# Patient Record
Sex: Male | Born: 1985 | Race: White | Hispanic: No | Marital: Married | Smoking: Current every day smoker
Health system: Southern US, Community
[De-identification: ages and names within clinical notes are randomized; demographics above are authoritative.]

## PROBLEM LIST (undated history)

## (undated) DIAGNOSIS — H53009 Unspecified amblyopia, unspecified eye: Secondary | ICD-10-CM

## (undated) HISTORY — PX: WISDOM TOOTH EXTRACTION: SHX21

## (undated) HISTORY — PX: EYE SURGERY: SHX253

---

## 2011-12-08 ENCOUNTER — Emergency Department (HOSPITAL_BASED_OUTPATIENT_CLINIC_OR_DEPARTMENT_OTHER)
Admission: EM | Admit: 2011-12-08 | Discharge: 2011-12-08 | Disposition: A | Discharge status: Home / Self Care | Payer: Self-pay | Attending: Emergency Medicine | Admitting: Emergency Medicine

## 2011-12-08 ENCOUNTER — Encounter (HOSPITAL_BASED_OUTPATIENT_CLINIC_OR_DEPARTMENT_OTHER): Payer: Self-pay | Admitting: Emergency Medicine

## 2011-12-08 DIAGNOSIS — H60399 Other infective otitis externa, unspecified ear: Secondary | ICD-10-CM | POA: Insufficient documentation

## 2011-12-08 DIAGNOSIS — F17200 Nicotine dependence, unspecified, uncomplicated: Secondary | ICD-10-CM | POA: Insufficient documentation

## 2011-12-08 DIAGNOSIS — H6090 Unspecified otitis externa, unspecified ear: Secondary | ICD-10-CM

## 2011-12-08 HISTORY — DX: Unspecified amblyopia, unspecified eye: H53.009

## 2011-12-08 MED ORDER — CIPROFLOXACIN-HYDROCORTISONE 0.2-1 % OT SUSP: 3.0000 [drp] | Freq: Two times a day (BID) | OTIC | Status: AC

## 2011-12-08 NOTE — ED Notes (Signed)
Rec'd call from pt requesting new rx since original was "too expensive"  RX changed to Cortiosporin Otic Suspension 4 gtts to affected ear 4xday for 7days by Doran Durand, PA as discussed with Dr Fonnie Jarvis.  RX called to [facility] in [place] [phone] and spoke with Sam, PharmD.  Pt was called back by Heather, NS and informed of rx change and location

## 2011-12-08 NOTE — ED Notes (Signed)
Pt c/o LT ear pain x 2 days

## 2011-12-08 NOTE — ED Provider Notes (Signed)
Medical screening examination/treatment/procedure(s) were performed by non-physician practitioner and as supervising physician I was immediately available for consultation/collaboration.   Charles B. Bernette Mayers, MD 12/08/11 2152

## 2011-12-08 NOTE — ED Provider Notes (Signed)
History     CSN: [id number]  Arrival date & time 12/08/11  1111   First MD Initiated Contact with Patient 12/08/11 1204      Chief Complaint  Patient presents with  . Otalgia    (Consider location/radiation/quality/duration/timing/severity/associated sxs/prior treatment) Patient is a 26 y.o. male presenting with ear pain. The history is provided by the patient.  Otalgia This is a new problem. The current episode started 2 days ago. There is pain in the left ear. The problem occurs constantly. The problem has been gradually worsening. There has been no fever. Pertinent negatives include no ear discharge, no headaches, no hearing loss, no rhinorrhea, no sore throat, no vomiting, no neck pain, no cough and no rash. His past medical history does not include chronic ear infection or hearing loss.    Past Medical History  Diagnosis Date  . Lazy eye     RT    Past Surgical History  Procedure Date  . Eye surgery   . Wisdom tooth extraction     No family history on file.  History  Substance Use Topics  . Smoking status: Current Everyday Smoker -- 0.2 packs/day    Types: Cigarettes  . Smokeless tobacco: Not on file  . Alcohol Use:       Review of Systems  Constitutional: Negative for fever and chills.  HENT: Positive for ear pain. Negative for hearing loss, congestion, sore throat, rhinorrhea, neck pain, neck stiffness, postnasal drip, sinus pressure, tinnitus and ear discharge.   Respiratory: Negative for cough.   Gastrointestinal: Negative for nausea and vomiting.  Skin: Negative for color change and rash.  Neurological: Negative for dizziness, light-headedness and headaches.    Allergies  Penicillins  Home Medications  No current outpatient prescriptions on file.  BP 133/86  Pulse 86  Temp 97.7 F (36.5 C) (Oral)  Resp 16  Ht 5\' 11"  (1.803 m)  Wt 190 lb (86.183 kg)  BMI 26.50 kg/m2  SpO2 99%  Physical Exam  Nursing note and vitals  reviewed. Constitutional: He is oriented to person, place, and time. He appears well-developed and well-nourished. No distress.  HENT:  Head: Normocephalic and atraumatic.  Right Ear: Hearing, tympanic membrane, external ear and ear canal normal. No drainage.  Left Ear: Hearing, tympanic membrane and external ear normal. No drainage.  Nose: Nose normal. Right sinus exhibits no maxillary sinus tenderness and no frontal sinus tenderness. Left sinus exhibits no maxillary sinus tenderness and no frontal sinus tenderness.  Mouth/Throat: Uvula is midline, oropharynx is clear and moist and mucous membranes are normal.       Erythema and swelling of the left EAC Pain with retraction of the left ear No mastoid tenderness  Neck: Normal range of motion. Neck supple.  Cardiovascular: Normal rate, regular rhythm and normal heart sounds.   Pulmonary/Chest: Effort normal and breath sounds normal. No respiratory distress.  Neurological: He is alert and oriented to person, place, and time.  Skin: Skin is warm and dry. He is not diaphoretic. No erythema.  Psychiatric: He has a normal mood and affect.    ED Course  Procedures (including critical care time)  Labs Reviewed - No data to display No results found.   No diagnosis found.    MDM  Patient with signs and symptoms consistent with Acute Otitis Externa.  Patient prescribed antibiotic ear drops and discharged home.          Pascal Lux [place], PA-C 12/08/11 1232

## 2014-05-01 ENCOUNTER — Encounter (HOSPITAL_BASED_OUTPATIENT_CLINIC_OR_DEPARTMENT_OTHER): Payer: Self-pay

## 2014-05-01 DIAGNOSIS — Z720 Tobacco use: Secondary | ICD-10-CM | POA: Insufficient documentation

## 2014-05-01 DIAGNOSIS — Z880 Allergy status to penicillin: Secondary | ICD-10-CM | POA: Diagnosis not present

## 2014-05-01 DIAGNOSIS — Y9289 Other specified places as the place of occurrence of the external cause: Secondary | ICD-10-CM | POA: Diagnosis not present

## 2014-05-01 DIAGNOSIS — X58XXXA Exposure to other specified factors, initial encounter: Secondary | ICD-10-CM | POA: Insufficient documentation

## 2014-05-01 DIAGNOSIS — Z8669 Personal history of other diseases of the nervous system and sense organs: Secondary | ICD-10-CM | POA: Diagnosis not present

## 2014-05-01 DIAGNOSIS — S39012A Strain of muscle, fascia and tendon of lower back, initial encounter: Secondary | ICD-10-CM | POA: Insufficient documentation

## 2014-05-01 DIAGNOSIS — Y9389 Activity, other specified: Secondary | ICD-10-CM | POA: Diagnosis not present

## 2014-05-01 DIAGNOSIS — S3992XA Unspecified injury of lower back, initial encounter: Secondary | ICD-10-CM | POA: Diagnosis present

## 2014-05-01 DIAGNOSIS — Y990 Civilian activity done for income or pay: Secondary | ICD-10-CM | POA: Diagnosis not present

## 2014-05-01 NOTE — ED Notes (Addendum)
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 Denies incontinence. Denies numbness/tingling.

## 2014-05-02 ENCOUNTER — Emergency Department (HOSPITAL_BASED_OUTPATIENT_CLINIC_OR_DEPARTMENT_OTHER)
Admission: EM | Admit: 2014-05-02 | Discharge: 2014-05-02 | Disposition: A | Discharge status: Home / Self Care | Payer: BLUE CROSS/BLUE SHIELD | Attending: Emergency Medicine | Admitting: Emergency Medicine

## 2014-05-02 DIAGNOSIS — S39012A Strain of muscle, fascia and tendon of lower back, initial encounter: Secondary | ICD-10-CM

## 2014-05-02 MED ORDER — DIAZEPAM 5 MG PO TABS
5.0000 mg | ORAL_TABLET | Freq: Once | ORAL | Status: AC
  Administered 2014-05-02: 5 mg via ORAL
  Filled 2014-05-02: qty 1

## 2014-05-02 MED ORDER — KETOROLAC TROMETHAMINE 60 MG/2ML IM SOLN
60.0000 mg | Freq: Once | INTRAMUSCULAR | Status: AC
  Administered 2014-05-02: 60 mg via INTRAMUSCULAR
  Filled 2014-05-02: qty 2

## 2014-05-02 MED ORDER — NAPROXEN 500 MG PO TABS: 500.0000 mg | ORAL_TABLET | Freq: Two times a day (BID) | ORAL | Status: DC | PRN

## 2014-05-02 MED ORDER — ACETAMINOPHEN 500 MG PO TABS
1000.0000 mg | ORAL_TABLET | Freq: Once | ORAL | Status: AC
  Administered 2014-05-02: 1000 mg via ORAL
  Filled 2014-05-02: qty 2
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
## 2014-05-02 NOTE — Discharge Instructions (Signed)
If you were given medicines take as directed.  If you are on coumadin or contraceptives realize their levels and effectiveness is altered by many different medicines.  If you have any reaction (rash, tongues swelling, other) to the medicines stop taking and see a physician.   Please follow up as directed and return to the ER or see a physician for new or worsening symptoms.  Thank you. Filed Vitals:   05/01/14 2349  BP: 142/77  Pulse: 90  Temp: 98.6 F (37 C)  Resp: 18  Height: 5\' 11"  (1.803 m)  Weight: 180 lb (81.647 kg)  SpO2: 98%

## 2014-06-27 ENCOUNTER — Encounter (HOSPITAL_BASED_OUTPATIENT_CLINIC_OR_DEPARTMENT_OTHER): Payer: Self-pay

## 2014-06-27 ENCOUNTER — Emergency Department (HOSPITAL_BASED_OUTPATIENT_CLINIC_OR_DEPARTMENT_OTHER)
Admission: EM | Admit: 2014-06-27 | Discharge: 2014-06-27 | Disposition: A | Discharge status: Home / Self Care | Payer: BLUE CROSS/BLUE SHIELD | Attending: Emergency Medicine | Admitting: Emergency Medicine

## 2014-06-27 ENCOUNTER — Emergency Department (HOSPITAL_BASED_OUTPATIENT_CLINIC_OR_DEPARTMENT_OTHER): Payer: BLUE CROSS/BLUE SHIELD

## 2014-06-27 DIAGNOSIS — R0602 Shortness of breath: Secondary | ICD-10-CM | POA: Diagnosis not present

## 2014-06-27 DIAGNOSIS — R079 Chest pain, unspecified: Secondary | ICD-10-CM | POA: Diagnosis not present

## 2014-06-27 DIAGNOSIS — R1110 Vomiting, unspecified: Secondary | ICD-10-CM | POA: Insufficient documentation

## 2014-06-27 DIAGNOSIS — Z79899 Other long term (current) drug therapy: Secondary | ICD-10-CM | POA: Diagnosis not present

## 2014-06-27 DIAGNOSIS — Z720 Tobacco use: Secondary | ICD-10-CM | POA: Insufficient documentation

## 2014-06-27 DIAGNOSIS — Z880 Allergy status to penicillin: Secondary | ICD-10-CM | POA: Insufficient documentation

## 2014-06-27 DIAGNOSIS — R05 Cough: Secondary | ICD-10-CM | POA: Insufficient documentation

## 2014-06-27 LAB — CBC
HEMATOCRIT: 45.3 % (ref 39.0–52.0)
HEMOGLOBIN: 15.5 g/dL (ref 13.0–17.0)
MCH: 30.9 pg (ref 26.0–34.0)
MCHC: 34.2 g/dL (ref 30.0–36.0)
MCV: 90.2 fL (ref 78.0–100.0)
Platelets: 180 10*3/uL (ref 150–400)
RBC: 5.02 MIL/uL (ref 4.22–5.81)
RDW: 12.8 % (ref 11.5–15.5)
WBC: 12.2 10*3/uL — ABNORMAL HIGH (ref 4.0–10.5)

## 2014-06-27 LAB — BASIC METABOLIC PANEL
Anion gap: 5 (ref 5–15)
BUN: 22 mg/dL (ref 6–23)
CALCIUM: 9 mg/dL (ref 8.4–10.5)
CO2: 28 mmol/L (ref 19–32)
CREATININE: 1.04 mg/dL (ref 0.50–1.35)
Chloride: 102 mmol/L (ref 96–112)
GFR calc non Af Amer: >90 mL/min (ref >90)
GLUCOSE: 118 mg/dL — AB (ref 70–99)
Potassium: 3.8 mmol/L (ref 3.5–5.1)
Sodium: 135 mmol/L (ref 135–145)

## 2014-06-27 LAB — TROPONIN I: Troponin I: <0.03 ng/mL (ref <0.031)

## 2014-06-27 MED ORDER — SODIUM CHLORIDE 0.9 % IV BOLUS (SEPSIS)
1000.0000 mL | INTRAVENOUS | Status: AC
  Administered 2014-06-27: 1000 mL via INTRAVENOUS

## 2014-06-27 MED ORDER — NAPROXEN 500 MG PO TABS: 500.0000 mg | ORAL_TABLET | Freq: Two times a day (BID) | ORAL | Status: AC

## 2014-06-27 MED ORDER — ONDANSETRON HCL 4 MG/2ML IJ SOLN
4.0000 mg | INTRAMUSCULAR | Status: AC
  Administered 2014-06-27: 4 mg via INTRAVENOUS
  Filled 2014-06-27: qty 2

## 2014-06-27 MED ORDER — KETOROLAC TROMETHAMINE 30 MG/ML IJ SOLN
30.0000 mg | Freq: Once | INTRAMUSCULAR | Status: AC
  Administered 2014-06-27: 30 mg via INTRAVENOUS
  Filled 2014-06-27: qty 1

## 2014-06-27 NOTE — Discharge Instructions (Signed)
Please follow the directions provided. Be sure to follow-up with your primary care doctor to discuss your visit to the emergency department. Be sure to drink plenty of fluids by mouth to stay well hydrated. Please take the naproxen twice a day to help with pain and inflammation. Don't hesitate to return for any new, worsening, or concerning symptoms.   SEEK IMMEDIATE MEDICAL CARE IF:  You experience panic attack symptoms that are different than your usual symptoms.  You have serious thoughts about hurting yourself or others.  You are taking medicine for panic attacks and have a serious side effect.

## 2014-06-27 NOTE — ED Notes (Addendum)
Patient reports that he has had increased anxiety and stress since mother passed last May. Chest pain intermittent x 1 week. Reports that it often comes while at work. Dizziness and shortness of breath with same. Started prozac 2 weeks ago, thinks chest pain is related

## 2014-06-27 NOTE — ED Provider Notes (Signed)
CSN: [id number]     Arrival date & time 06/27/14  09-01-1117 History   First MD Initiated Contact with Patient 06/27/14 1158     Chief Complaint  Patient presents with  . Chest Pain   (Consider location/radiation/quality/duration/timing/severity/associated sxs/prior Treatment) HPI  Dakota Hale is a 29 year old male presenting with chest pain. He states this chest pain has been ongoing intermittently since last 09/02/2022 when his mother died suddenly. He states he's seen his PCP for increased anxiety and was started on Prozac. He's been on the Prozac approximate 2 weeks and reports continued episodes of anxiety, nervousness. This morning he arrived to work at 8:30 AM and upon entering his workplace he began to feel very anxious and have sharp central chest pain and shortness of breath. The symptoms worsened as he had an argument with his boss. He reports one episode of vomiting during the stressful situation. The pain resolved after he left his workplace and he currently rates the pain as a 2-3 out of 10. He associates the pain whenever he is in a stressful situation. He denies any pain or shortness of breath with exertion. He reports a dry cough but denies  any fevers, chills, leg swelling or hemoptysis.  Past Medical History  Diagnosis Date  . Lazy eye     RT   Past Surgical History  Procedure Laterality Date  . Eye surgery    . Wisdom tooth extraction     No family history on file. History  Substance Use Topics  . Smoking status: Current Every Day Smoker -- 0.25 packs/day    Types: Cigarettes  . Smokeless tobacco: Not on file  . Alcohol Use: No    Review of Systems  Constitutional: Negative for fever and chills.  HENT: Negative for sore throat.   Eyes: Negative for visual disturbance.  Respiratory: Positive for cough and shortness of breath.   Cardiovascular: Positive for chest pain. Negative for leg swelling.  Gastrointestinal: Positive for vomiting. Negative for nausea and diarrhea.   Genitourinary: Negative for dysuria.  Musculoskeletal: Negative for myalgias.  Skin: Negative for rash.  Neurological: Negative for weakness, numbness and headaches.    Allergies  Penicillins  Home Medications   Prior to Admission medications   Medication Sig Start Date End Date Taking? Authorizing Provider  FLUoxetine (PROZAC) 10 MG tablet Take 10 mg by mouth daily.   Yes Historical Provider, MD   BP 134/88 mmHg  Pulse 104  Temp(Src) 99 F (37.2 C) (Oral)  Resp 18  Ht  (1.778 m)  Wt 185 lb (83.915 kg)  BMI 26.54 kg/m2  SpO2 100% Physical Exam  Constitutional: He is oriented to person, place, and time. He appears well-developed and well-nourished. No distress.  HENT:  Head: Normocephalic and atraumatic.  Mouth/Throat: Oropharynx is clear and moist.  Eyes: Conjunctivae are normal.  Neck: Neck supple.  Cardiovascular: Normal rate, regular rhythm and intact distal pulses.  Exam reveals no gallop and no friction rub.   No murmur heard. Pulmonary/Chest: Effort normal and breath sounds normal. No respiratory distress. He has no wheezes. He has no rales. He exhibits tenderness.    Abdominal: Soft. There is no tenderness.  Musculoskeletal: He exhibits no tenderness.  Lymphadenopathy:    He has no cervical adenopathy.  Neurological: He is alert and oriented to person, place, and time.  Skin: Skin is warm and dry. No rash noted. He is not diaphoretic.  Psychiatric: He has a normal mood and affect.  Nursing note and vitals  reviewed.   ED Course  Procedures (including critical care time) Labs Review Labs Reviewed  CBC - Abnormal; Notable for the following:    WBC 12.2 (*)    All other components within normal limits  BASIC METABOLIC PANEL - Abnormal; Notable for the following:    Glucose, Bld 118 (*)    All other components within normal limits  TROPONIN I    Imaging Review Dg Chest 2 View  06/27/2014   CLINICAL DATA:  Intermittent chest pain for 1 week.  Dizziness and shortness of breath. Initial encounter.  EXAM: CHEST  2 VIEW  COMPARISON:  None.  FINDINGS: Cardiopericardial silhouette within normal limits. Mediastinal contours normal. Trachea midline. No airspace disease or effusion. Monitoring leads project over the chest.  IMPRESSION: No active cardiopulmonary disease.   Electronically Signed   By: Andreas NewportGeoffrey  Lamke M.D.   On: 06/27/2014 14:10     EKG Interpretation   Date/Time:  Sunday June 27 2014 11:28:46 EST Ventricular Rate:  118 PR Interval:  132 QRS Duration: 82 QT Interval:  292 QTC Calculation: 409 R Axis:   116 Text Interpretation:  Sinus tachycardia Right axis deviation Abnormal ECG  Confirmed by DELOS  MD, DOUGLAS ([id number]) on 06/27/2014 12:04:55 PM      MDM   Final diagnoses:  Chest pain, unspecified chest pain type   29 yo with chest pain related to stressful situations. Chest pain is not likely of cardiac or pulmonary etiology d/t presentation,  EKG without acute abnormalities, negative troponin, and negative CXR. Pt's symptoms improved after NS bolus with toradol. Patient is to be discharged with recommendation to follow up with PCP in regards to today's hospital visit.  Pt has been advised start a PPI and return to the ED is CP becomes exertional, associated with diaphoresis or nausea, radiates to left jaw/arm, worsens or becomes concerning in any way. Pt appears reliable for follow up and is agreeable to discharge.       Filed Vitals:   06/27/14 1125 06/27/14 1200 06/27/14 1300 06/27/14 1429  BP: 134/88 124/63 119/72 112/57  Pulse: 104 96 93 90  Temp: 99 F (37.2 C)     TempSrc: Oral     Resp: 18 14 17 16   Height: 5\' 10"  (1.778 m)     Weight: 185 lb (83.915 kg)     SpO2: 100% 100% 100% 100%   Meds given in ED:  Medications  sodium chloride 0.9 % bolus 1,000 mL (0 mLs Intravenous Stopped 06/27/14 1430)  ketorolac (TORADOL) 30 MG/ML injection 30 mg (30 mg Intravenous Given 06/27/14 1311)  ondansetron (ZOFRAN)  injection 4 mg (4 mg Intravenous Given 06/27/14 1309)    Discharge Medication List as of 06/27/2014  2:20 PM    START taking these medications   Details  naproxen (NAPROSYN) 500 MG tablet Take 1 tablet (500 mg total) by mouth 2 (two) times daily., Starting 06/27/2014, Until Discontinued, Print           Harle BattiestElizabeth Kylene Zamarron, NP 06/29/14 [id number]  Geoffery Lyonsouglas Delo, MD 07/01/14 [id number]

## 2016-10-03 IMAGING — CR DG CHEST 2V
2 series · 2 of 2 positions shown · non-contrast
Comparison: None.

CLINICAL DATA: Intermittent chest pain for 1 week. Dizziness and
shortness of breath. Initial encounter.

EXAM:
CHEST  2 VIEW

[w chest pa]
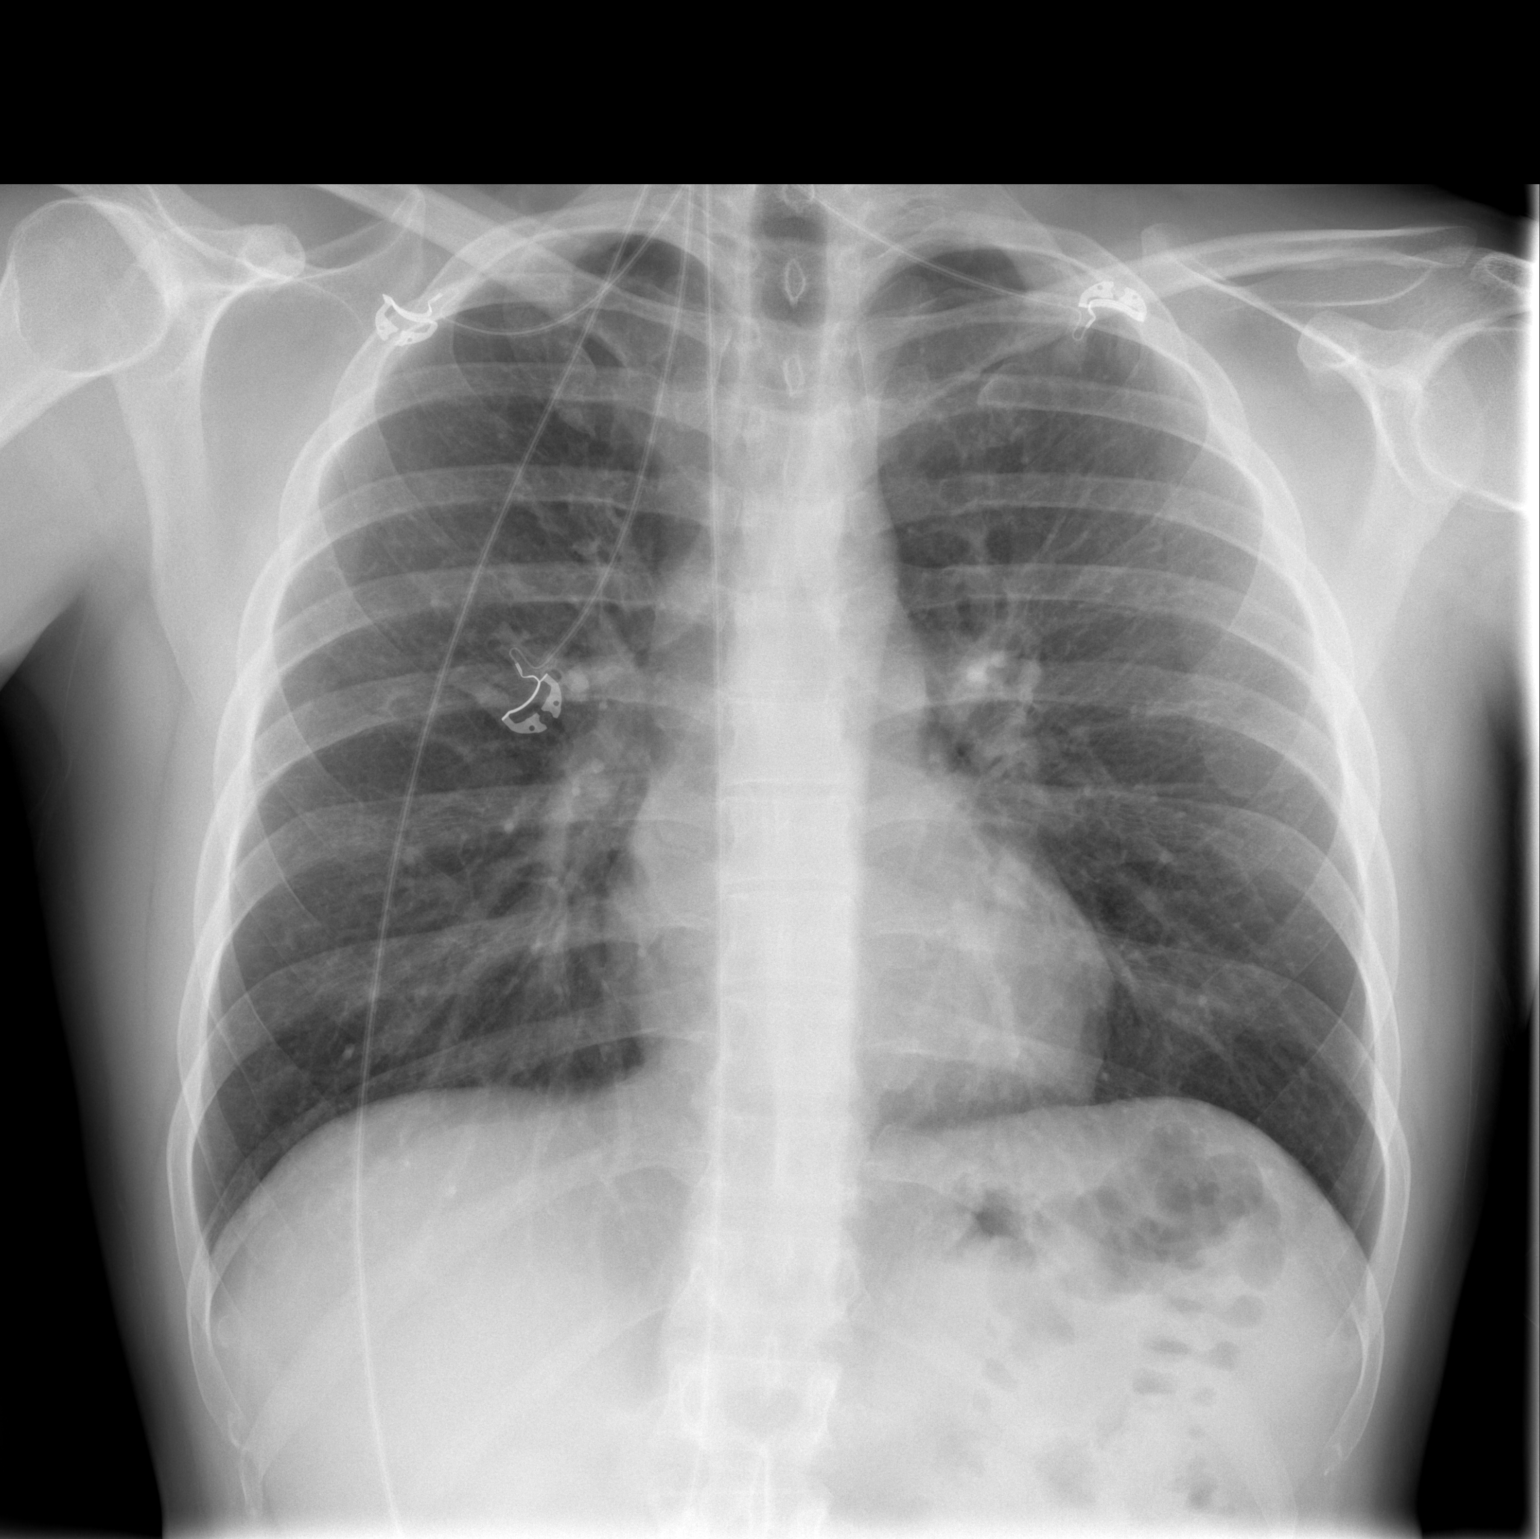

[w chest lat]
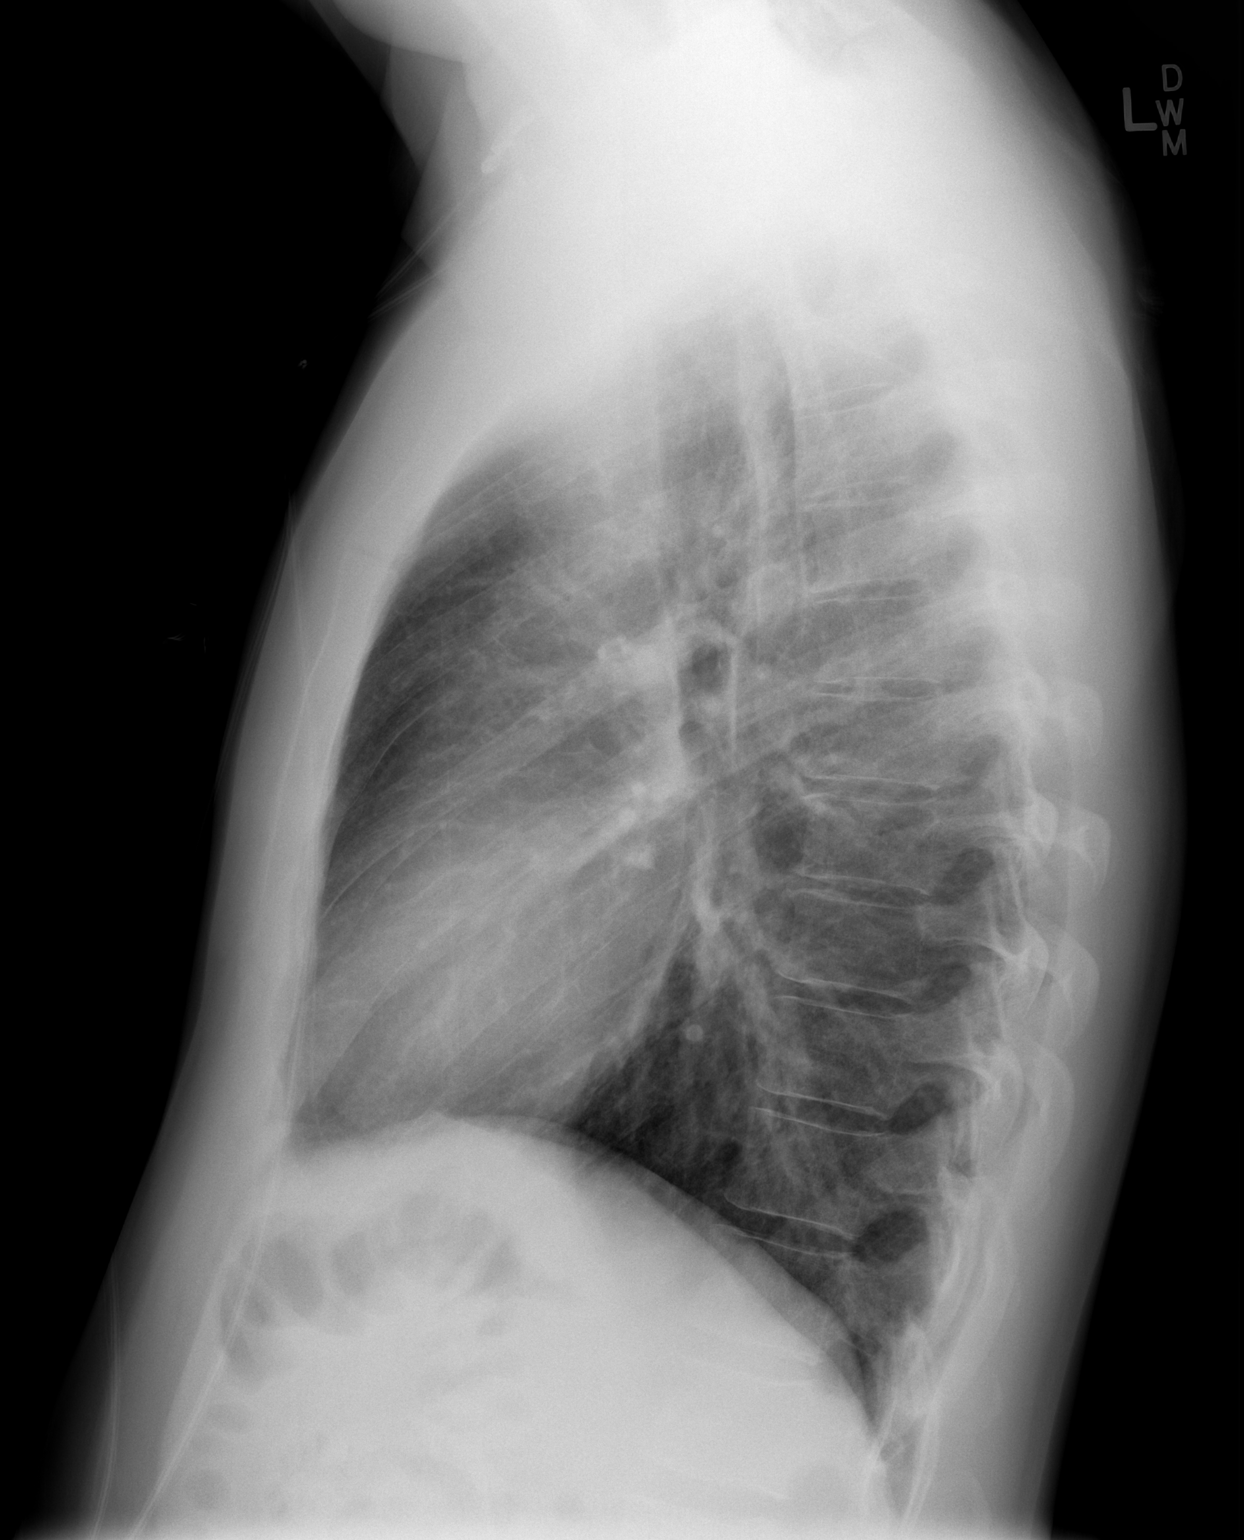

[2 of 2 positions shown; findings below may reference images not displayed]

FINDINGS: Cardiopericardial silhouette within normal limits. Mediastinal
contours normal. Trachea midline. No airspace disease or effusion.
Monitoring leads project over the chest.
IMPRESSION: No active cardiopulmonary disease.
